# Patient Record
Sex: Male | Born: 1980 | Race: White | Hispanic: No | Marital: Married | Smoking: Former smoker
Health system: Southern US, Community
[De-identification: ages and names within clinical notes are randomized; demographics above are authoritative.]

## PROBLEM LIST (undated history)

## (undated) DIAGNOSIS — G8929 Other chronic pain: Secondary | ICD-10-CM

## (undated) DIAGNOSIS — F419 Anxiety disorder, unspecified: Secondary | ICD-10-CM

## (undated) DIAGNOSIS — M549 Dorsalgia, unspecified: Secondary | ICD-10-CM

## (undated) DIAGNOSIS — M542 Cervicalgia: Secondary | ICD-10-CM

## (undated) HISTORY — PX: HERNIA REPAIR: SHX51

## (undated) HISTORY — PX: APPENDECTOMY: SHX54

## (undated) HISTORY — PX: EYE SURGERY: SHX253

---

## 2012-12-17 ENCOUNTER — Encounter: Payer: Self-pay | Admitting: Anesthesiology

## 2013-01-13 ENCOUNTER — Encounter: Payer: Self-pay | Admitting: Anesthesiology

## 2013-02-12 ENCOUNTER — Encounter: Payer: Self-pay | Admitting: Anesthesiology

## 2013-05-29 ENCOUNTER — Ambulatory Visit: Payer: Self-pay

## 2013-05-29 LAB — CBC WITH DIFFERENTIAL/PLATELET
Basophil #: 0 10*3/uL (ref 0.0–0.1)
Basophil %: 0.3 %
Eosinophil #: 0.5 10*3/uL (ref 0.0–0.7)
Eosinophil %: 3.8 %
HCT: 44.4 % (ref 40.0–52.0)
HGB: 14.8 g/dL (ref 13.0–18.0)
LYMPHS PCT: 11.5 %
Lymphocyte #: 1.4 10*3/uL (ref 1.0–3.6)
MCH: 29 pg (ref 26.0–34.0)
MCHC: 33.3 g/dL (ref 32.0–36.0)
MCV: 87 fL (ref 80–100)
Monocyte #: 1.1 x10 3/mm — ABNORMAL HIGH (ref 0.2–1.0)
Monocyte %: 8.8 %
Neutrophil #: 9.5 10*3/uL — ABNORMAL HIGH (ref 1.4–6.5)
Neutrophil %: 75.6 %
PLATELETS: 266 10*3/uL (ref 150–440)
RBC: 5.11 10*6/uL (ref 4.40–5.90)
RDW: 13.2 % (ref 11.5–14.5)
WBC: 12.6 10*3/uL — ABNORMAL HIGH (ref 3.8–10.6)

## 2013-05-29 LAB — COMPREHENSIVE METABOLIC PANEL
ALBUMIN: 4.8 g/dL (ref 3.4–5.0)
ALT: 70 U/L (ref 12–78)
Alkaline Phosphatase: 122 U/L — ABNORMAL HIGH
Anion Gap: 15 (ref 7–16)
BUN: 10 mg/dL (ref 7–18)
Bilirubin,Total: 0.5 mg/dL (ref 0.2–1.0)
CHLORIDE: 99 mmol/L (ref 98–107)
CREATININE: 1.04 mg/dL (ref 0.60–1.30)
Calcium, Total: 9.2 mg/dL (ref 8.5–10.1)
Co2: 22 mmol/L (ref 21–32)
EGFR (African American): >60
EGFR (Non-African Amer.): >60
GLUCOSE: 114 mg/dL — AB (ref 65–99)
OSMOLALITY: 272 (ref 275–301)
Potassium: 3.6 mmol/L (ref 3.5–5.1)
SGOT(AST): 27 U/L (ref 15–37)
SODIUM: 136 mmol/L (ref 136–145)
TOTAL PROTEIN: 8.3 g/dL — AB (ref 6.4–8.2)

## 2014-10-05 ENCOUNTER — Ambulatory Visit
Admission: EM | Admit: 2014-10-05 | Discharge: 2014-10-05 | Disposition: A | Discharge status: Home / Self Care | Payer: Medicare Other | Attending: Family Medicine | Admitting: Family Medicine

## 2014-10-05 DIAGNOSIS — H6012 Cellulitis of left external ear: Secondary | ICD-10-CM | POA: Diagnosis not present

## 2014-10-05 HISTORY — DX: Anxiety disorder, unspecified: F41.9

## 2014-10-05 HISTORY — DX: Other chronic pain: G89.29

## 2014-10-05 HISTORY — DX: Cervicalgia: M54.2

## 2014-10-05 HISTORY — DX: Dorsalgia, unspecified: M54.9

## 2014-10-05 MED ORDER — SULFAMETHOXAZOLE-TRIMETHOPRIM 800-160 MG PO TABS: 1.0000 | ORAL_TABLET | Freq: Two times a day (BID) | ORAL | Status: DC

## 2014-10-05 NOTE — ED Notes (Signed)
Compliant of possible bug bite to left ear helix x 1 week.

## 2014-10-05 NOTE — ED Provider Notes (Signed)
CSN: [id number]     Arrival date & time 10/05/14  1724 History   First MD Initiated Contact with Patient 10/05/14 1815     Chief Complaint  Patient presents with  . "Ear infection from insect bite"    (Consider location/radiation/quality/duration/timing/severity/associated sxs/prior Treatment) HPI Comments: Patient presents with a 1 week h/o left earlobe pain, redness, and a h/o pus drainage several days ago. States thinks was bitten by some insect but not sure. Denies any fevers, chills, or hearing problems.  The history is provided by the patient.    Past Medical History  Diagnosis Date  . Anxiety   . Chronic back pain   . Chronic neck pain    Past Surgical History  Procedure Laterality Date  . Appendectomy    . Hernia repair    . Eye surgery      Cataract Surgery   No family history on file. History  Substance Use Topics  . Smoking status: Former Games developermoker  . Smokeless tobacco: Not on file  . Alcohol Use: No    Review of Systems  Allergies  Wellbutrin  Home Medications   Prior to Admission medications   Medication Sig Start Date End Date Taking? Authorizing Provider  amitriptyline (ELAVIL) 50 MG tablet Take 50 mg by mouth at bedtime as needed for sleep.   Yes Historical Provider, MD  cyclobenzaprine (FLEXERIL) 10 MG tablet Take 10 mg by mouth 3 (three) times daily as needed for muscle spasms.   Yes Historical Provider, MD  escitalopram (LEXAPRO) 10 MG tablet Take 10 mg by mouth daily.   Yes Historical Provider, MD  oxyCODONE-acetaminophen (PERCOCET/ROXICET) 5-325 MG per tablet Take by mouth every 6 (six) hours as needed for severe pain.   Yes Historical Provider, MD  VERAPAMIL HCL PO Take by mouth.   Yes Historical Provider, MD  sulfamethoxazole-trimethoprim (BACTRIM DS,SEPTRA DS) 800-160 MG per tablet Take 1 tablet by mouth 2 (two) times daily. 10/05/14   Payton Mccallumrlando Janessa Mickle, MD   BP 147/89 mmHg  Pulse 97  Temp(Src) 97.9 F (36.6 C) (Oral)  Resp 18  Ht 5\' 10"  (1.778  m)  Wt 185 lb (83.915 kg)  BMI 26.54 kg/m2  SpO2 98% Physical Exam  Constitutional: He appears well-developed and well-nourished. No distress.  Skin: He is not diaphoretic. There is erythema.  Left upper earlobe skin with blanchable erythema and tenderness to palpation; no purulent drainage    ED Course  Procedures (including critical care time) Labs Review Labs Reviewed - No data to display  Imaging Review No results found.   MDM   1. Cellulitis of earlobe, left    Discharge Medication List as of 10/05/2014  6:37 PM    START taking these medications   Details  sulfamethoxazole-trimethoprim (BACTRIM DS,SEPTRA DS) 800-160 MG per tablet Take 1 tablet by mouth 2 (two) times daily., Starting 10/05/2014, Until Discontinued, Normal      Plan: 1. Diagnosis reviewed with patient 2. rx as per orders; risks, benefits, potential side effects reviewed with patient 3. Recommend supportive treatment with warm compresses 4. F/u prn if symptoms worsen or don't improve    Payton Mccallumrlando Jashay Roddy, MD 10/05/14 [phone]

## 2015-04-29 ENCOUNTER — Ambulatory Visit
Admission: EM | Admit: 2015-04-29 | Discharge: 2015-04-29 | Disposition: A | Discharge status: Home / Self Care | Payer: Medicare Other | Attending: Family Medicine | Admitting: Family Medicine

## 2015-04-29 ENCOUNTER — Encounter: Payer: Self-pay | Admitting: Emergency Medicine

## 2015-04-29 DIAGNOSIS — H6981 Other specified disorders of Eustachian tube, right ear: Secondary | ICD-10-CM

## 2015-04-29 MED ORDER — FLUTICASONE PROPIONATE 50 MCG/ACT NA SUSP: 2.0000 | Freq: Every day | NASAL | Status: DC

## 2015-04-29 MED ORDER — CETIRIZINE-PSEUDOEPHEDRINE ER 5-120 MG PO TB12: 1.0000 | ORAL_TABLET | Freq: Two times a day (BID) | ORAL | Status: DC

## 2015-04-29 NOTE — ED Provider Notes (Signed)
CSN: [id number]     Arrival date & time 04/29/15  1548 History   First MD Initiated Contact with Patient 04/29/15 1613     Chief Complaint  Patient presents with  . Otalgia   (Consider location/radiation/quality/duration/timing/severity/associated sxs/prior Treatment) HPI   This a 34 year old male who presents with right ear pain he had last 3 days or so. Radiates into the lower part of his ear into his neck was also swollen more so last night than today. He has had some drainage from his ear but denies any decreased  hearing or ringing. The drainage is characterized as oily. His father advised him to put peroxide and in his ear; he states that he had large return of brownish material. This did not completely help his ear pain though. He denies any fever or chills. He had numerous problems with his ears as a child requiring multiple tubes. His PCP is evaluating his gait due to imbalance. He is using a cane to ambulate  Past Medical History  Diagnosis Date  . Anxiety   . Chronic back pain   . Chronic neck pain    Past Surgical History  Procedure Laterality Date  . Appendectomy    . Hernia repair    . Eye surgery      Cataract Surgery   History reviewed. No pertinent family history. Social History  Substance Use Topics  . Smoking status: Former Games developermoker  . Smokeless tobacco: None  . Alcohol Use: No    Review of Systems  Constitutional: Negative for fever, chills, activity change, appetite change and fatigue.  HENT: Positive for ear discharge, ear pain and facial swelling. Negative for hearing loss, postnasal drip, rhinorrhea, sinus pressure, sore throat and tinnitus.     Allergies  Wellbutrin  Home Medications   Prior to Admission medications   Medication Sig Start Date End Date Taking? Authorizing Provider  amitriptyline (ELAVIL) 50 MG tablet Take 50 mg by mouth at bedtime as needed for sleep.    Historical Provider, MD  cetirizine-pseudoephedrine (ZYRTEC-D) 5-120 MG  tablet Take 1 tablet by mouth 2 (two) times daily. 04/29/15   Lutricia FeilWilliam P Emile Ringgenberg, PA-C  cyclobenzaprine (FLEXERIL) 10 MG tablet Take 10 mg by mouth 3 (three) times daily as needed for muscle spasms.    Historical Provider, MD  escitalopram (LEXAPRO) 10 MG tablet Take 10 mg by mouth daily.    Historical Provider, MD  fluticasone (FLONASE) 50 MCG/ACT nasal spray Place 2 sprays into both nostrils daily. 04/29/15   Lutricia FeilWilliam P Ashna Dorough, PA-C  oxyCODONE-acetaminophen (PERCOCET/ROXICET) 5-325 MG per tablet Take by mouth every 6 (six) hours as needed for severe pain.    Historical Provider, MD  sulfamethoxazole-trimethoprim (BACTRIM DS,SEPTRA DS) 800-160 MG per tablet Take 1 tablet by mouth 2 (two) times daily. 10/05/14   Payton Mccallumrlando Conty, MD  VERAPAMIL HCL PO Take 240 mg by mouth at bedtime.     Historical Provider, MD   Meds Ordered and Administered this Visit  Medications - No data to display  BP 150/88 mmHg  Pulse 94  Temp(Src) 98 F (36.7 C) (Tympanic)  Resp 16  Ht 5\' 10"  (1.778 m)  Wt 175 lb (79.379 kg)  BMI 25.11 kg/m2  SpO2 99% No data found.   Physical Exam  Constitutional: He is oriented to person, place, and time. He appears well-developed and well-nourished. No distress.  HENT:  Head: Normocephalic and atraumatic.  Examination the right ear is a small amount of cerumen inferiorly. Eardrum is visualized but is  seen to have air-fluid level behind. No erythema present. A light reflex is present but scattered. Is no active drainage present and no debris is noted in the canal other than the above-mentioned cerumen. There is no adenopathy present. There is some mild tenderness inferior to the ear over the throat.  Eyes: Conjunctivae are normal. Pupils are equal, round, and reactive to light.  Neck: Normal range of motion. Neck supple.  Musculoskeletal: Normal range of motion. He exhibits no edema or tenderness.  Vision ambulates with a cane due to balance problem  Lymphadenopathy:    He has no  cervical adenopathy.  Neurological: He is alert and oriented to person, place, and time.  Skin: Skin is warm and dry. He is not diaphoretic.  Psychiatric: He has a normal mood and affect. His behavior is normal. Judgment and thought content normal.  Nursing note and vitals reviewed.   ED Course  Procedures (including critical care time)  Labs Review Labs Reviewed - No data to display  Imaging Review No results found.   Visual Acuity Review  Right Eye Distance:   Left Eye Distance:   Bilateral Distance:    Right Eye Near:   Left Eye Near:    Bilateral Near:         MDM   1. Dysfunction of right Eustachian tube    Discharge Medication List as of 04/29/2015  4:34 PM    START taking these medications   Details  cetirizine-pseudoephedrine (ZYRTEC-D) 5-120 MG tablet Take 1 tablet by mouth 2 (two) times daily., Starting 04/29/2015, Until Discontinued, Normal    fluticasone (FLONASE) 50 MCG/ACT nasal spray Place 2 sprays into both nostrils daily., Starting 04/29/2015, Until Discontinued, Normal      Plan: 1. Diagnosis reviewed with patient 2. rx as per orders; risks, benefits, potential side effects reviewed with patient 3. Recommend supportive treatment with use of Flonase and decongestants. I told him to be seen by ENT if he continues to have problems within a week or 2. 4. F/u prn if symptoms worsen or don't improve      Lutricia Feil, PA-C 04/29/15 1723

## 2015-04-29 NOTE — ED Notes (Signed)
Patient c/o pain in his right ear since yesterday.  Patient denies fevers.

## 2016-04-28 ENCOUNTER — Ambulatory Visit
Admission: EM | Admit: 2016-04-28 | Discharge: 2016-04-28 | Disposition: A | Discharge status: Home / Self Care | Payer: Medicare Other | Attending: Family Medicine | Admitting: Family Medicine

## 2016-04-28 ENCOUNTER — Ambulatory Visit: Payer: Medicare Other

## 2016-04-28 ENCOUNTER — Encounter: Payer: Self-pay | Admitting: Gynecology

## 2016-04-28 DIAGNOSIS — Z888 Allergy status to other drugs, medicaments and biological substances status: Secondary | ICD-10-CM | POA: Insufficient documentation

## 2016-04-28 DIAGNOSIS — Z9889 Other specified postprocedural states: Secondary | ICD-10-CM | POA: Insufficient documentation

## 2016-04-28 DIAGNOSIS — F419 Anxiety disorder, unspecified: Secondary | ICD-10-CM | POA: Insufficient documentation

## 2016-04-28 DIAGNOSIS — Z79899 Other long term (current) drug therapy: Secondary | ICD-10-CM | POA: Diagnosis not present

## 2016-04-28 DIAGNOSIS — S2241XA Multiple fractures of ribs, right side, initial encounter for closed fracture: Secondary | ICD-10-CM

## 2016-04-28 DIAGNOSIS — Z87891 Personal history of nicotine dependence: Secondary | ICD-10-CM | POA: Diagnosis not present

## 2016-04-28 DIAGNOSIS — G8929 Other chronic pain: Secondary | ICD-10-CM | POA: Insufficient documentation

## 2016-04-28 DIAGNOSIS — W19XXXA Unspecified fall, initial encounter: Secondary | ICD-10-CM | POA: Insufficient documentation

## 2016-04-28 MED ORDER — HYDROCODONE-ACETAMINOPHEN 5-325 MG PO TABS: ORAL_TABLET | ORAL | 0 refills | Status: DC

## 2016-04-28 NOTE — ED Triage Notes (Signed)
Per patient fall on his right side at home x 6 days ago. Patient stated while sitting on a chair at home fell on his right side. Per patient now with right rib pain and bruise at nipple area.

## 2016-04-28 NOTE — ED Provider Notes (Signed)
[facility]    CSN: [id number] Arrival date & time: 04/28/16  1416     History   Chief Complaint Chief Complaint  Patient presents with  . Fall    HPI Jerry Lester is a 35 y.o. male.   35 yo male with a c/o pain to mid right chest wall/ribs for the past 6 days since falling at home and hitting his ribs. Denies shortness of breath, fevers, chills.    The history is provided by the patient.  Fall     Past Medical History:  Diagnosis Date  . Anxiety   . Chronic back pain   . Chronic neck pain     There are no active problems to display for this patient.   Past Surgical History:  Procedure Laterality Date  . APPENDECTOMY    . EYE SURGERY     Cataract Surgery  . HERNIA REPAIR         Home Medications    Prior to Admission medications   Medication Sig Start Date End Date Taking? Authorizing Provider  amitriptyline (ELAVIL) 50 MG tablet Take 50 mg by mouth at bedtime as needed for sleep.   Yes Historical Provider, MD  cyclobenzaprine (FLEXERIL) 10 MG tablet Take 10 mg by mouth 3 (three) times daily as needed for muscle spasms.   Yes Historical Provider, MD  escitalopram (LEXAPRO) 10 MG tablet Take 10 mg by mouth daily.   Yes Historical Provider, MD  oxyCODONE-acetaminophen (PERCOCET/ROXICET) 5-325 MG per tablet Take by mouth every 6 (six) hours as needed for severe pain.   Yes Historical Provider, MD  VERAPAMIL HCL PO Take 240 mg by mouth at bedtime.    Yes Historical Provider, MD  cetirizine-pseudoephedrine (ZYRTEC-D) 5-120 MG tablet Take 1 tablet by mouth 2 (two) times daily. 04/29/15   Lutricia FeilWilliam P Roemer, PA-C  fluticasone (FLONASE) 50 MCG/ACT nasal spray Place 2 sprays into both nostrils daily. 04/29/15   Lutricia FeilWilliam P Roemer, PA-C  HYDROcodone-acetaminophen (NORCO/VICODIN) 5-325 MG tablet 1-2 tabs po bid prn 04/28/16   Payton Mccallumrlando Legacy Lacivita, MD  sulfamethoxazole-trimethoprim (BACTRIM DS,SEPTRA DS) 800-160 MG per tablet Take 1 tablet by mouth 2 (two) times  daily. 10/05/14   Payton Mccallumrlando Avionna Bower, MD    Family History No family history on file.  Social History Social History  Substance Use Topics  . Smoking status: Former Games developermoker  . Smokeless tobacco: Never Used  . Alcohol use No     Allergies   Wellbutrin [bupropion]   Review of Systems Review of Systems   Physical Exam Triage Vital Signs ED Triage Vitals  Enc Vitals Group     BP 04/28/16 1640 (!) 147/73     Pulse Rate 04/28/16 1640 86     Resp 04/28/16 1640 16     Temp 04/28/16 1640 98.6 F (37 C)     Temp Source 04/28/16 1640 Oral     SpO2 04/28/16 1640 100 %     Weight 04/28/16 1643 190 lb (86.2 kg)     Height 04/28/16 1643 5\' 10"  (1.778 m)     Head Circumference --      Peak Flow --      Pain Score 04/28/16 1645 8     Pain Loc --      Pain Edu? --      Excl. in GC? --    No data found.   Updated Vital Signs BP (!) 147/73 (BP Location: Left Arm)   Pulse 86   Temp 98.6 F (37  C) (Oral)   Resp 16   Ht 5\' 10"  (1.778 m)   Wt 190 lb (86.2 kg)   SpO2 100%   BMI 27.26 kg/m   Visual Acuity Right Eye Distance:   Left Eye Distance:   Bilateral Distance:    Right Eye Near:   Left Eye Near:    Bilateral Near:     Physical Exam  Constitutional: He appears well-developed and well-nourished. No distress.  Cardiovascular: Normal rate, regular rhythm and normal heart sounds.   No murmur heard. Pulmonary/Chest: Effort normal and breath sounds normal. No respiratory distress. He has no wheezes. He has no rales. He exhibits tenderness (right mid chest wall ribs).  Skin: He is not diaphoretic.  Nursing note and vitals reviewed.    UC Treatments / Results  Labs (all labs ordered are listed, but only abnormal results are displayed) Labs Reviewed - No data to display  EKG  EKG Interpretation None       Radiology Dg Ribs Unilateral W/chest Right  Result Date: 04/28/2016 CLINICAL DATA:  Right rib pain, fall 2 days ago. EXAM: RIGHT RIBS AND CHEST - 3+ VIEW  COMPARISON:  None. FINDINGS: No pneumothorax or pleural effusion. Linear subsegmental atelectasis or scarring along the left lung base laterally. Cardiac and mediastinal margins appear normal. On one of the views, there is cortical irregularity of the anterior most portion of the right fifth and sixth rib suspicious for nondisplaced fractures. IMPRESSION: 1. Nondisplaced right fifth and sixth rib fractures anteriorly. No pleural effusion or pneumothorax. Electronically Signed   By: Gaylyn RongWalter  Liebkemann M.D.   On: 04/28/2016 17:08    Procedures Procedures (including critical care time)  Medications Ordered in UC Medications - No data to display   Initial Impression / Assessment and Plan / UC Course  I have reviewed the triage vital signs and the nursing notes.  Pertinent labs & imaging results that were available during my care of the patient were reviewed by me and considered in my medical decision making (see chart for details).  Clinical Course       Final Clinical Impressions(s) / UC Diagnoses   Final diagnoses:  Closed fracture of multiple ribs of right side, initial encounter    New Prescriptions Discharge Medication List as of 04/28/2016  5:26 PM    START taking these medications   Details  HYDROcodone-acetaminophen (NORCO/VICODIN) 5-325 MG tablet 1-2 tabs po bid prn, Print       1. x-ray results and diagnosis reviewed with patient 2. rx as per orders above; reviewed possible side effects, interactions, risks and benefits  3. Recommend supportive treatment with rest, ice 4. Follow up with PCP next week 5. Follow-up prn if symptoms worsen or don't improve   Payton Mccallumrlando Anibal Quinby, MD 04/28/16 1925

## 2016-05-10 ENCOUNTER — Ambulatory Visit
Admission: EM | Admit: 2016-05-10 | Discharge: 2016-05-10 | Disposition: A | Discharge status: Home / Self Care | Payer: Medicare Other | Attending: Internal Medicine | Admitting: Internal Medicine

## 2016-05-10 ENCOUNTER — Encounter: Payer: Self-pay | Admitting: Emergency Medicine

## 2016-05-10 DIAGNOSIS — R69 Illness, unspecified: Secondary | ICD-10-CM

## 2016-05-10 DIAGNOSIS — J209 Acute bronchitis, unspecified: Secondary | ICD-10-CM

## 2016-05-10 DIAGNOSIS — H6691 Otitis media, unspecified, right ear: Secondary | ICD-10-CM | POA: Diagnosis not present

## 2016-05-10 DIAGNOSIS — J111 Influenza due to unidentified influenza virus with other respiratory manifestations: Secondary | ICD-10-CM

## 2016-05-10 MED ORDER — PREDNISONE 50 MG PO TABS: 50.0000 mg | ORAL_TABLET | Freq: Every day | ORAL | 0 refills | Status: AC

## 2016-05-10 MED ORDER — BENZONATATE 200 MG PO CAPS: 200.0000 mg | ORAL_CAPSULE | Freq: Three times a day (TID) | ORAL | 1 refills | Status: DC | PRN

## 2016-05-10 MED ORDER — IPRATROPIUM-ALBUTEROL 0.5-2.5 (3) MG/3ML IN SOLN
3.0000 mL | Freq: Once | RESPIRATORY_TRACT | Status: AC
  Administered 2016-05-10: 3 mL via RESPIRATORY_TRACT

## 2016-05-10 MED ORDER — METHYLPREDNISOLONE SODIUM SUCC 125 MG IJ SOLR
125.0000 mg | Freq: Once | INTRAMUSCULAR | Status: AC
  Administered 2016-05-10: 125 mg via INTRAMUSCULAR

## 2016-05-10 MED ORDER — AZITHROMYCIN 250 MG PO TABS: 250.0000 mg | ORAL_TABLET | Freq: Every day | ORAL | 0 refills | Status: DC

## 2016-05-10 NOTE — ED Triage Notes (Addendum)
Cough, congested for 5 days. Has red streaks in clear phlegm today

## 2016-05-10 NOTE — Discharge Instructions (Addendum)
Anticipate gradual improvement in cough/well being over the next several days.  Recheck or followup with primary care provider for new fever >100.5, increasing phlegm production/nasal discharge, or if not starting to improve in a few days.  Prescriptions sent to the [facility] in [place].

## 2016-05-10 NOTE — ED Provider Notes (Signed)
[facility]    CSN: [id number] Arrival date & time: 05/10/16  1242     History   Chief Complaint Chief Complaint  Patient presents with  . Cough    HPI Jerry Lester is a 35 y.o. male. Patient presents today with at least a 5 day history of coughing spasms, feels like it is nonstop. Scant mucus production, tiny streak of blood yesterday. Some tactile temperatures. Nose is a little runny today, but has not particularly been congested or runny. No sore throat. Some posttussive emesis. Not vomiting otherwise. No diarrhea. A lot of achiness around his ribs, particularly with cough.   HPI  Past Medical History:  Diagnosis Date  . Anxiety   . Chronic back pain   . Chronic neck pain      Past Surgical History:  Procedure Laterality Date  . APPENDECTOMY    . EYE SURGERY     Cataract Surgery  . HERNIA REPAIR         Home Medications    Prior to Admission medications   Medication Sig Start Date End Date Taking? Authorizing Provider  amitriptyline (ELAVIL) 50 MG tablet Take 50 mg by mouth at bedtime as needed for sleep.   Yes Historical Provider, MD  cetirizine-pseudoephedrine (ZYRTEC-D) 5-120 MG tablet Take 1 tablet by mouth 2 (two) times daily. 04/29/15  Yes Lutricia FeilWilliam P Roemer, PA-C  cyclobenzaprine (FLEXERIL) 10 MG tablet Take 10 mg by mouth 3 (three) times daily as needed for muscle spasms.   Yes Historical Provider, MD  escitalopram (LEXAPRO) 10 MG tablet Take 10 mg by mouth daily.   Yes Historical Provider, MD  HYDROcodone-acetaminophen (NORCO/VICODIN) 5-325 MG tablet 1-2 tabs po bid prn 04/28/16  Yes Payton Mccallumrlando Conty, MD  oxyCODONE-acetaminophen (PERCOCET/ROXICET) 5-325 MG per tablet Take by mouth every 6 (six) hours as needed for severe pain.   Yes Historical Provider, MD  VERAPAMIL HCL PO Take 240 mg by mouth at bedtime.    Yes Historical Provider, MD  azithromycin (ZITHROMAX) 250 MG tablet Take 1 tablet (250 mg total) by mouth daily. Take first 2 tablets  together, then 1 every day until finished. 05/10/16   Eustace MooreLaura W Shaheer Bonfield, MD  benzonatate (TESSALON) 200 MG capsule Take 1 capsule (200 mg total) by mouth 3 (three) times daily as needed for cough. 05/10/16   Eustace MooreLaura W Lakoda Mcanany, MD  fluticasone [facility](FLONASE) 50 MCG/ACT nasal spray Place 2 sprays into both nostrils daily. 04/29/15   Lutricia FeilWilliam P Roemer, PA-C  predniSONE (DELTASONE) 50 MG tablet Take 1 tablet (50 mg total) by mouth daily. 05/10/16 05/15/16  Eustace MooreLaura W Zaineb Nowaczyk, MD    Family History No family history on file.  Social History Social History  Substance Use Topics  . Smoking status: Former Games developermoker  . Smokeless tobacco: Never Used  . Alcohol use No     Allergies   Wellbutrin [bupropion]   Review of Systems Review of Systems  All other systems reviewed and are negative.    Physical Exam Triage Vital Signs ED Triage Vitals  Enc Vitals Group     BP 05/10/16 1454 131/86     Pulse Rate 05/10/16 1454 90     Resp 05/10/16 1454 18     Temp 05/10/16 1454 98.4 F (36.9 C)     Temp Source 05/10/16 1454 Tympanic     SpO2 05/10/16 1454 100 %     Weight 05/10/16 1457 190 lb (86.2 kg)     Height 05/10/16 1457 5\' 10"  (1.778 m)  Pain Score 05/10/16 1458 8   Updated Vital Signs BP 131/86 (BP Location: Left Arm)   Pulse 90   Temp 98.4 F (36.9 C) (Tympanic)   Resp 18   Ht 5\' 10"  (1.778 m)   Wt 190 lb (86.2 kg)   SpO2 100%   BMI 27.26 kg/m  Physical Exam  Constitutional: He is oriented to person, place, and time. No distress.  Alert, nicely groomed  HENT:  Head: Atraumatic.  Eyes:  Conjugate gaze, no eye redness/drainage  Neck: Neck supple.  Cardiovascular: Normal rate.   Pulmonary/Chest: No respiratory distress. He has no wheezes. He has no rales.   breath sounds are quite coarse, but symmetric throughout  Abdominal: He exhibits no distension.  Musculoskeletal: Normal range of motion.  Neurological: He is alert and oriented to person, place, and time.  Skin: Skin is warm and  dry.  No cyanosis  Nursing note and vitals reviewed.    UC Treatments / Results   Procedures Procedures (including critical care time)  Medications Ordered in UC Medications  ipratropium-albuterol (DUONEB) 0.5-2.5 (3) MG/3ML nebulizer solution 3 mL (not administered)  methylPREDNISolone sodium succinate (SOLU-MEDROL) 125 mg/2 mL injection 125 mg (125 mg Intramuscular Given 05/10/16 1552)    Final Clinical Impressions(s) / UC Diagnoses   Final diagnoses:  Acute bronchitis with bronchospasm  Influenza-like illness  Right acute otitis media   Anticipate gradual improvement in cough/well being over the next several days.  Recheck or followup with primary care provider for new fever >100.5, increasing phlegm production/nasal discharge, or if not starting to improve in a few days.  Prescriptions sent to the [facility] in [place].    New Prescriptions New Prescriptions   AZITHROMYCIN (ZITHROMAX) 250 MG TABLET    Take 1 tablet (250 mg total) by mouth daily. Take first 2 tablets together, then 1 every day until finished.   BENZONATATE (TESSALON) 200 MG CAPSULE    Take 1 capsule (200 mg total) by mouth 3 (three) times daily as needed for cough.   PREDNISONE (DELTASONE) 50 MG TABLET    Take 1 tablet (50 mg total) by mouth daily.     Eustace MooreLaura W Arieon Corcoran, MD 05/13/16 [phone]

## 2016-06-09 DIAGNOSIS — M545 Low back pain: Secondary | ICD-10-CM | POA: Diagnosis not present

## 2016-06-12 DIAGNOSIS — M79621 Pain in right upper arm: Secondary | ICD-10-CM | POA: Diagnosis not present

## 2016-06-12 DIAGNOSIS — M25522 Pain in left elbow: Secondary | ICD-10-CM | POA: Diagnosis not present

## 2016-06-12 DIAGNOSIS — M25512 Pain in left shoulder: Secondary | ICD-10-CM | POA: Diagnosis not present

## 2016-06-12 DIAGNOSIS — M25511 Pain in right shoulder: Secondary | ICD-10-CM | POA: Diagnosis not present

## 2016-06-12 DIAGNOSIS — M5412 Radiculopathy, cervical region: Secondary | ICD-10-CM | POA: Diagnosis not present

## 2016-06-12 DIAGNOSIS — G894 Chronic pain syndrome: Secondary | ICD-10-CM | POA: Diagnosis not present

## 2016-06-12 DIAGNOSIS — M79622 Pain in left upper arm: Secondary | ICD-10-CM | POA: Diagnosis not present

## 2016-06-12 DIAGNOSIS — M25521 Pain in right elbow: Secondary | ICD-10-CM | POA: Diagnosis not present

## 2016-06-12 DIAGNOSIS — Z79891 Long term (current) use of opiate analgesic: Secondary | ICD-10-CM | POA: Diagnosis not present

## 2016-06-13 DIAGNOSIS — H26493 Other secondary cataract, bilateral: Secondary | ICD-10-CM | POA: Diagnosis not present

## 2016-06-19 DIAGNOSIS — Z87891 Personal history of nicotine dependence: Secondary | ICD-10-CM | POA: Diagnosis not present

## 2016-06-19 DIAGNOSIS — Z79899 Other long term (current) drug therapy: Secondary | ICD-10-CM | POA: Diagnosis not present

## 2016-06-19 DIAGNOSIS — Z13220 Encounter for screening for lipoid disorders: Secondary | ICD-10-CM | POA: Diagnosis not present

## 2016-06-19 DIAGNOSIS — R42 Dizziness and giddiness: Secondary | ICD-10-CM | POA: Diagnosis not present

## 2016-06-19 DIAGNOSIS — Z8669 Personal history of other diseases of the nervous system and sense organs: Secondary | ICD-10-CM | POA: Diagnosis not present

## 2016-06-19 DIAGNOSIS — R948 Abnormal results of function studies of other organs and systems: Secondary | ICD-10-CM | POA: Diagnosis not present

## 2016-06-19 DIAGNOSIS — Z23 Encounter for immunization: Secondary | ICD-10-CM | POA: Diagnosis not present

## 2016-06-19 DIAGNOSIS — F419 Anxiety disorder, unspecified: Secondary | ICD-10-CM | POA: Diagnosis not present

## 2016-06-19 DIAGNOSIS — Z131 Encounter for screening for diabetes mellitus: Secondary | ICD-10-CM | POA: Diagnosis not present

## 2016-06-27 DIAGNOSIS — R002 Palpitations: Secondary | ICD-10-CM | POA: Diagnosis not present

## 2016-06-27 DIAGNOSIS — R0602 Shortness of breath: Secondary | ICD-10-CM | POA: Diagnosis not present

## 2016-06-27 DIAGNOSIS — R42 Dizziness and giddiness: Secondary | ICD-10-CM | POA: Diagnosis not present

## 2016-06-29 DIAGNOSIS — Z6826 Body mass index (BMI) 26.0-26.9, adult: Secondary | ICD-10-CM | POA: Diagnosis not present

## 2016-06-29 DIAGNOSIS — M542 Cervicalgia: Secondary | ICD-10-CM | POA: Diagnosis not present

## 2016-06-29 DIAGNOSIS — I10 Essential (primary) hypertension: Secondary | ICD-10-CM | POA: Diagnosis not present

## 2016-06-29 DIAGNOSIS — M47812 Spondylosis without myelopathy or radiculopathy, cervical region: Secondary | ICD-10-CM | POA: Diagnosis not present

## 2016-06-30 ENCOUNTER — Other Ambulatory Visit: Payer: Self-pay | Admitting: Orthopaedic Surgery

## 2016-06-30 DIAGNOSIS — M542 Cervicalgia: Secondary | ICD-10-CM

## 2016-06-30 DIAGNOSIS — M47812 Spondylosis without myelopathy or radiculopathy, cervical region: Secondary | ICD-10-CM

## 2016-07-03 DIAGNOSIS — Z961 Presence of intraocular lens: Secondary | ICD-10-CM | POA: Diagnosis not present

## 2016-07-04 DIAGNOSIS — R42 Dizziness and giddiness: Secondary | ICD-10-CM | POA: Diagnosis not present

## 2016-07-17 DIAGNOSIS — M79622 Pain in left upper arm: Secondary | ICD-10-CM | POA: Diagnosis not present

## 2016-07-17 DIAGNOSIS — G894 Chronic pain syndrome: Secondary | ICD-10-CM | POA: Diagnosis not present

## 2016-07-17 DIAGNOSIS — M79621 Pain in right upper arm: Secondary | ICD-10-CM | POA: Diagnosis not present

## 2016-07-17 DIAGNOSIS — M545 Low back pain: Secondary | ICD-10-CM | POA: Diagnosis not present

## 2016-07-17 DIAGNOSIS — Z79891 Long term (current) use of opiate analgesic: Secondary | ICD-10-CM | POA: Diagnosis not present

## 2016-07-17 DIAGNOSIS — M25512 Pain in left shoulder: Secondary | ICD-10-CM | POA: Diagnosis not present

## 2016-07-17 DIAGNOSIS — M542 Cervicalgia: Secondary | ICD-10-CM | POA: Diagnosis not present

## 2016-07-17 DIAGNOSIS — M25522 Pain in left elbow: Secondary | ICD-10-CM | POA: Diagnosis not present

## 2016-07-19 ENCOUNTER — Ambulatory Visit
Admission: RE | Admit: 2016-07-19 | Discharge: 2016-07-19 | Disposition: A | Discharge status: Home / Self Care | Payer: Medicare Other | Source: Ambulatory Visit | Attending: Orthopaedic Surgery | Admitting: Orthopaedic Surgery

## 2016-07-19 DIAGNOSIS — M50222 Other cervical disc displacement at C5-C6 level: Secondary | ICD-10-CM | POA: Diagnosis not present

## 2016-07-19 DIAGNOSIS — M542 Cervicalgia: Secondary | ICD-10-CM | POA: Diagnosis present

## 2016-07-19 DIAGNOSIS — M50221 Other cervical disc displacement at C4-C5 level: Secondary | ICD-10-CM | POA: Diagnosis not present

## 2016-07-19 DIAGNOSIS — M4802 Spinal stenosis, cervical region: Secondary | ICD-10-CM | POA: Diagnosis not present

## 2016-07-19 DIAGNOSIS — M47812 Spondylosis without myelopathy or radiculopathy, cervical region: Secondary | ICD-10-CM

## 2016-07-25 DIAGNOSIS — R0602 Shortness of breath: Secondary | ICD-10-CM | POA: Diagnosis not present

## 2016-08-02 DIAGNOSIS — M542 Cervicalgia: Secondary | ICD-10-CM | POA: Diagnosis not present

## 2016-08-02 DIAGNOSIS — M47812 Spondylosis without myelopathy or radiculopathy, cervical region: Secondary | ICD-10-CM | POA: Diagnosis not present

## 2016-08-17 DIAGNOSIS — M5412 Radiculopathy, cervical region: Secondary | ICD-10-CM | POA: Diagnosis not present

## 2016-08-17 DIAGNOSIS — M25511 Pain in right shoulder: Secondary | ICD-10-CM | POA: Diagnosis not present

## 2016-08-17 DIAGNOSIS — Z79891 Long term (current) use of opiate analgesic: Secondary | ICD-10-CM | POA: Diagnosis not present

## 2016-08-17 DIAGNOSIS — M25521 Pain in right elbow: Secondary | ICD-10-CM | POA: Diagnosis not present

## 2016-08-17 DIAGNOSIS — M79621 Pain in right upper arm: Secondary | ICD-10-CM | POA: Diagnosis not present

## 2016-08-22 DIAGNOSIS — M4727 Other spondylosis with radiculopathy, lumbosacral region: Secondary | ICD-10-CM | POA: Diagnosis not present

## 2016-08-22 DIAGNOSIS — M5117 Intervertebral disc disorders with radiculopathy, lumbosacral region: Secondary | ICD-10-CM | POA: Diagnosis not present

## 2016-08-24 DIAGNOSIS — M47812 Spondylosis without myelopathy or radiculopathy, cervical region: Secondary | ICD-10-CM | POA: Diagnosis not present

## 2016-08-24 DIAGNOSIS — Z6826 Body mass index (BMI) 26.0-26.9, adult: Secondary | ICD-10-CM | POA: Diagnosis not present

## 2016-08-24 DIAGNOSIS — I10 Essential (primary) hypertension: Secondary | ICD-10-CM | POA: Diagnosis not present

## 2016-08-24 DIAGNOSIS — M791 Myalgia: Secondary | ICD-10-CM | POA: Diagnosis not present

## 2016-09-07 DIAGNOSIS — R259 Unspecified abnormal involuntary movements: Secondary | ICD-10-CM | POA: Diagnosis not present

## 2016-09-07 DIAGNOSIS — R4781 Slurred speech: Secondary | ICD-10-CM | POA: Diagnosis not present

## 2016-09-15 DIAGNOSIS — Z87891 Personal history of nicotine dependence: Secondary | ICD-10-CM | POA: Diagnosis not present

## 2016-09-15 DIAGNOSIS — G255 Other chorea: Secondary | ICD-10-CM | POA: Diagnosis not present

## 2016-09-15 DIAGNOSIS — G259 Extrapyramidal and movement disorder, unspecified: Secondary | ICD-10-CM | POA: Diagnosis not present

## 2016-09-15 DIAGNOSIS — R1310 Dysphagia, unspecified: Secondary | ICD-10-CM | POA: Diagnosis not present

## 2016-09-15 DIAGNOSIS — R471 Dysarthria and anarthria: Secondary | ICD-10-CM | POA: Diagnosis not present

## 2016-09-18 DIAGNOSIS — M545 Low back pain: Secondary | ICD-10-CM | POA: Diagnosis not present

## 2016-09-18 DIAGNOSIS — G894 Chronic pain syndrome: Secondary | ICD-10-CM | POA: Diagnosis not present

## 2016-09-18 DIAGNOSIS — M542 Cervicalgia: Secondary | ICD-10-CM | POA: Diagnosis not present

## 2016-09-18 DIAGNOSIS — Z79891 Long term (current) use of opiate analgesic: Secondary | ICD-10-CM | POA: Diagnosis not present

## 2016-09-29 DIAGNOSIS — G255 Other chorea: Secondary | ICD-10-CM | POA: Diagnosis not present

## 2016-09-29 DIAGNOSIS — R768 Other specified abnormal immunological findings in serum: Secondary | ICD-10-CM | POA: Diagnosis not present

## 2016-10-06 DIAGNOSIS — G255 Other chorea: Secondary | ICD-10-CM | POA: Diagnosis not present

## 2016-10-24 DIAGNOSIS — M47812 Spondylosis without myelopathy or radiculopathy, cervical region: Secondary | ICD-10-CM | POA: Diagnosis not present

## 2016-10-24 DIAGNOSIS — M542 Cervicalgia: Secondary | ICD-10-CM | POA: Diagnosis not present

## 2016-11-08 DIAGNOSIS — R768 Other specified abnormal immunological findings in serum: Secondary | ICD-10-CM | POA: Diagnosis not present

## 2016-11-08 DIAGNOSIS — G255 Other chorea: Secondary | ICD-10-CM | POA: Diagnosis not present

## 2017-06-02 ENCOUNTER — Encounter: Payer: Self-pay | Admitting: *Deleted

## 2017-06-02 ENCOUNTER — Ambulatory Visit
Admission: EM | Admit: 2017-06-02 | Discharge: 2017-06-02 | Disposition: A | Discharge status: Home / Self Care | Payer: Medicare Other | Attending: Family Medicine | Admitting: Family Medicine

## 2017-06-02 DIAGNOSIS — R112 Nausea with vomiting, unspecified: Secondary | ICD-10-CM | POA: Diagnosis not present

## 2017-06-02 DIAGNOSIS — R69 Illness, unspecified: Secondary | ICD-10-CM | POA: Diagnosis not present

## 2017-06-02 DIAGNOSIS — G43009 Migraine without aura, not intractable, without status migrainosus: Secondary | ICD-10-CM | POA: Insufficient documentation

## 2017-06-02 DIAGNOSIS — Z79899 Other long term (current) drug therapy: Secondary | ICD-10-CM | POA: Diagnosis not present

## 2017-06-02 DIAGNOSIS — J111 Influenza due to unidentified influenza virus with other respiratory manifestations: Secondary | ICD-10-CM | POA: Insufficient documentation

## 2017-06-02 DIAGNOSIS — F419 Anxiety disorder, unspecified: Secondary | ICD-10-CM | POA: Diagnosis not present

## 2017-06-02 DIAGNOSIS — R51 Headache: Secondary | ICD-10-CM | POA: Diagnosis present

## 2017-06-02 DIAGNOSIS — Z87891 Personal history of nicotine dependence: Secondary | ICD-10-CM | POA: Insufficient documentation

## 2017-06-02 LAB — BASIC METABOLIC PANEL
Anion gap: 8 (ref 5–15)
BUN: 13 mg/dL (ref 6–20)
CO2: 22 mmol/L (ref 22–32)
Calcium: 8.9 mg/dL (ref 8.9–10.3)
Chloride: 104 mmol/L (ref 101–111)
Creatinine, Ser: 1.1 mg/dL (ref 0.61–1.24)
GFR calc Af Amer: >60 mL/min (ref >60)
GLUCOSE: 133 mg/dL — AB (ref 65–99)
POTASSIUM: 3.6 mmol/L (ref 3.5–5.1)
Sodium: 134 mmol/L — ABNORMAL LOW (ref 135–145)

## 2017-06-02 MED ORDER — ONDANSETRON 8 MG PO TBDP
8.0000 mg | ORAL_TABLET | Freq: Once | ORAL | Status: AC
  Administered 2017-06-02: 8 mg via ORAL

## 2017-06-02 MED ORDER — KETOROLAC TROMETHAMINE 60 MG/2ML IM SOLN
60.0000 mg | Freq: Once | INTRAMUSCULAR | Status: AC
  Administered 2017-06-02: 60 mg via INTRAMUSCULAR

## 2017-06-02 MED ORDER — ACETAMINOPHEN 500 MG PO TABS
1000.0000 mg | ORAL_TABLET | Freq: Once | ORAL | Status: AC
  Administered 2017-06-02: 1000 mg via ORAL

## 2017-06-02 MED ORDER — HYDROCODONE-ACETAMINOPHEN 5-325 MG PO TABS: ORAL_TABLET | ORAL | 0 refills | Status: DC

## 2017-06-02 MED ORDER — ONDANSETRON 8 MG PO TBDP: 8.0000 mg | ORAL_TABLET | Freq: Three times a day (TID) | ORAL | 0 refills | Status: AC | PRN

## 2017-06-02 MED ORDER — OSELTAMIVIR PHOSPHATE 75 MG PO CAPS: 75.0000 mg | ORAL_CAPSULE | Freq: Two times a day (BID) | ORAL | 0 refills | Status: DC

## 2017-06-02 NOTE — ED Provider Notes (Signed)
[facility]    CSN: [id number] Arrival date & time: 06/02/17  7829     History   Chief Complaint Chief Complaint  Patient presents with  . Headache  . Nausea  . Emesis    HPI Jerry Lester is a 37 y.o. male.   The history is provided by the patient.  URI  Presenting symptoms: fatigue, fever and rhinorrhea   Severity:  Moderate Onset quality:  Sudden Duration:  2 days Timing:  Constant Chronicity:  New Relieved by:  None tried Associated symptoms: headaches (has a h/o migraine and has had a migraine headache since yesterday not improved with usual migraine home medications) and myalgias (c/o bodyaches all over)   Associated symptoms: no wheezing   Risk factors: not elderly, no chronic cardiac disease, no chronic kidney disease, no chronic respiratory disease, no diabetes mellitus, no immunosuppression, no recent illness and no recent travel     Past Medical History:  Diagnosis Date  . Anxiety   . Chronic back pain   . Chronic neck pain     There are no active problems to display for this patient.   Past Surgical History:  Procedure Laterality Date  . APPENDECTOMY    . EYE SURGERY     Cataract Surgery  . HERNIA REPAIR         Home Medications    Prior to Admission medications   Medication Sig Start Date End Date Taking? Authorizing Provider  amitriptyline (ELAVIL) 50 MG tablet Take 50 mg by mouth at bedtime as needed for sleep.   Yes [provider]  cyclobenzaprine (FLEXERIL) 10 MG tablet Take 10 mg by mouth 3 (three) times daily as needed for muscle spasms.   Yes [provider]  escitalopram (LEXAPRO) 10 MG tablet Take 10 mg by mouth daily.   Yes [provider]  azithromycin (ZITHROMAX) 250 MG tablet Take 1 tablet (250 mg total) by mouth daily. Take first 2 tablets together, then 1 every day until finished. 05/10/16   Isa Rankin, MD  benzonatate (TESSALON) 200 MG capsule Take 1 capsule (200 mg total)  by mouth 3 (three) times daily as needed for cough. 05/10/16   Isa Rankin, MD  cetirizine-pseudoephedrine (ZYRTEC-D) 5-120 MG tablet Take 1 tablet by mouth 2 (two) times daily. 04/29/15   Lutricia Feil, PA-C  fluticasone (FLONASE) 50 MCG/ACT nasal spray Place 2 sprays into both nostrils daily. 04/29/15   Lutricia Feil, PA-C  HYDROcodone-acetaminophen (NORCO/VICODIN) 5-325 MG tablet 1-2 tabs po q 8 hours prn 06/02/17   Payton Mccallum, MD  ondansetron (ZOFRAN ODT) 8 MG disintegrating tablet Take 1 tablet (8 mg total) by mouth every 8 (eight) hours as needed. 06/02/17   Payton Mccallum, MD  oseltamivir (TAMIFLU) 75 MG capsule Take 1 capsule (75 mg total) by mouth 2 (two) times daily. 06/02/17   Payton Mccallum, MD  oxyCODONE-acetaminophen (PERCOCET/ROXICET) 5-325 MG per tablet Take by mouth every 6 (six) hours as needed for severe pain.    [provider]  VERAPAMIL HCL PO Take 240 mg by mouth at bedtime.     [provider]    Family History Family History  Problem Relation Age of Onset  . Migraines Mother   . Migraines Father     Social History Social History   Tobacco Use  . Smoking status: Former Games developer  . Smokeless tobacco: Never Used  Substance Use Topics  . Alcohol use: No  . Drug use: No  Allergies   Wellbutrin [bupropion]   Review of Systems Review of Systems  Constitutional: Positive for fatigue and fever.  HENT: Positive for rhinorrhea.   Respiratory: Negative for wheezing.   Musculoskeletal: Positive for myalgias (c/o bodyaches all over).  Neurological: Positive for headaches (has a h/o migraine and has had a migraine headache since yesterday not improved with usual migraine home medications).     Physical Exam Triage Vital Signs ED Triage Vitals  Enc Vitals Group     BP 06/02/17 0927 118/87     Pulse Rate 06/02/17 0927 (!) 131     Resp 06/02/17 0927 16     Temp 06/02/17 0927 (!) 101.6 F (38.7 C)     Temp Source 06/02/17  0927 Oral     SpO2 06/02/17 0927 96 %     Weight 06/02/17 0929 175 lb (79.4 kg)     Height 06/02/17 0929 5\' 10"  (1.778 m)     Head Circumference --      Peak Flow --      Pain Score 06/02/17 0930 10     Pain Loc --      Pain Edu? --      Excl. in GC? --    No data found.  Updated Vital Signs BP 118/87 (BP Location: Left Arm)   Pulse (!) 131   Temp (!) 101.2 F (38.4 C)   Resp 16   Ht 5\' 10"  (1.778 m)   Wt 175 lb (79.4 kg)   SpO2 96%   BMI 25.11 kg/m   Visual Acuity Right Eye Distance:   Left Eye Distance:   Bilateral Distance:    Right Eye Near:   Left Eye Near:    Bilateral Near:     Physical Exam  Constitutional: He is oriented to person, place, and time. He appears well-developed and well-nourished. No distress.  HENT:  Head: Normocephalic and atraumatic.  Right Ear: Tympanic membrane, external ear and ear canal normal.  Left Ear: Tympanic membrane, external ear and ear canal normal.  Nose: Nose normal.  Mouth/Throat: Uvula is midline, oropharynx is clear and moist and mucous membranes are normal. No oropharyngeal exudate or tonsillar abscesses.  Eyes: Conjunctivae and EOM are normal. Pupils are equal, round, and reactive to light. Right eye exhibits no discharge. Left eye exhibits no discharge. No scleral icterus.  Neck: Normal range of motion. Neck supple. No tracheal deviation present. No thyromegaly present.  Cardiovascular: Normal rate, regular rhythm and normal heart sounds.  Pulmonary/Chest: Effort normal and breath sounds normal. No stridor. No respiratory distress. He has no wheezes. He has no rales. He exhibits no tenderness.  Lymphadenopathy:    He has no cervical adenopathy.  Neurological: He is alert and oriented to person, place, and time. He displays normal reflexes. No cranial nerve deficit or sensory deficit. He exhibits normal muscle tone. Coordination normal.  Skin: Skin is warm and dry. No rash noted. He is not diaphoretic.  Nursing note and  vitals reviewed.    UC Treatments / Results  Labs (all labs ordered are listed, but only abnormal results are displayed) Labs Reviewed  BASIC METABOLIC PANEL - Abnormal; Notable for the following components:      Result Value   Sodium 134 (*)    Glucose, Bld 133 (*)    All other components within normal limits    EKG  EKG Interpretation None       Radiology No results found.  Procedures Procedures (including critical care time)  Medications Ordered in UC Medications  ondansetron (ZOFRAN-ODT) disintegrating tablet 8 mg (8 mg Oral Given 06/02/17 0941)  acetaminophen (TYLENOL) tablet 1,000 mg (1,000 mg Oral Given 06/02/17 0940)  ketorolac (TORADOL) injection 60 mg (60 mg Intramuscular Given 06/02/17 1010)     Initial Impression / Assessment and Plan / UC Course  I have reviewed the triage vital signs and the nursing notes.  Pertinent labs & imaging results that were available during my care of the patient were reviewed by me and considered in my medical decision making (see chart for details).       Final Clinical Impressions(s) / UC Diagnoses   Final diagnoses:  Migraine without aura and without status migrainosus, not intractable  Influenza-like illness    ED Discharge Orders        Ordered    oseltamivir (TAMIFLU) 75 MG capsule  2 times daily     06/02/17 1057    ondansetron (ZOFRAN ODT) 8 MG disintegrating tablet  Every 8 hours PRN     06/02/17 1057    HYDROcodone-acetaminophen (NORCO/VICODIN) 5-325 MG tablet     06/02/17 1057     1. Lab results and diagnosis reviewed with patient 2. Given Toradol 60mg  IM x 1 and zofran 8mg  odt with improvement of symptoms 3. rx as per orders above; reviewed possible side effects, interactions, risks and benefits  4. Follow-up prn if symptoms worsen or don't improve  Controlled Substance Prescriptions [place] Controlled Substance Registry consulted? Not Applicable   Payton Mccallum, MD 06/02/17 [phone]

## 2017-06-02 NOTE — ED Triage Notes (Signed)
Headache x3 days. Hx of migraines. This morning worse and also N/V, dizziness.

## 2017-06-05 ENCOUNTER — Telehealth: Payer: Self-pay

## 2017-06-05 NOTE — Telephone Encounter (Signed)
Called to follow up with patient since visit here at [facility]. Patient instructed to call back with any questions or concerns. MAH  

## 2017-06-22 DIAGNOSIS — M25562 Pain in left knee: Secondary | ICD-10-CM | POA: Diagnosis not present

## 2017-06-22 DIAGNOSIS — K591 Functional diarrhea: Secondary | ICD-10-CM | POA: Diagnosis not present

## 2017-06-22 DIAGNOSIS — L678 Other hair color and hair shaft abnormalities: Secondary | ICD-10-CM | POA: Diagnosis not present

## 2017-07-03 DIAGNOSIS — F633 Trichotillomania: Secondary | ICD-10-CM | POA: Diagnosis not present

## 2017-07-03 DIAGNOSIS — L739 Follicular disorder, unspecified: Secondary | ICD-10-CM | POA: Diagnosis not present

## 2017-07-03 DIAGNOSIS — L700 Acne vulgaris: Secondary | ICD-10-CM | POA: Diagnosis not present

## 2017-07-03 DIAGNOSIS — L219 Seborrheic dermatitis, unspecified: Secondary | ICD-10-CM | POA: Diagnosis not present

## 2017-07-03 DIAGNOSIS — L659 Nonscarring hair loss, unspecified: Secondary | ICD-10-CM | POA: Diagnosis not present

## 2017-07-03 DIAGNOSIS — L639 Alopecia areata, unspecified: Secondary | ICD-10-CM | POA: Diagnosis not present

## 2017-07-17 ENCOUNTER — Ambulatory Visit
Admission: EM | Admit: 2017-07-17 | Discharge: 2017-07-17 | Disposition: A | Discharge status: Home / Self Care | Payer: Medicare Other | Attending: Family Medicine | Admitting: Family Medicine

## 2017-07-17 ENCOUNTER — Other Ambulatory Visit: Payer: Self-pay

## 2017-07-17 ENCOUNTER — Encounter: Payer: Self-pay | Admitting: Emergency Medicine

## 2017-07-17 DIAGNOSIS — R51 Headache: Secondary | ICD-10-CM

## 2017-07-17 DIAGNOSIS — J029 Acute pharyngitis, unspecified: Secondary | ICD-10-CM

## 2017-07-17 DIAGNOSIS — J111 Influenza due to unidentified influenza virus with other respiratory manifestations: Secondary | ICD-10-CM

## 2017-07-17 DIAGNOSIS — M791 Myalgia, unspecified site: Secondary | ICD-10-CM | POA: Diagnosis not present

## 2017-07-17 DIAGNOSIS — R05 Cough: Secondary | ICD-10-CM | POA: Diagnosis not present

## 2017-07-17 DIAGNOSIS — R69 Illness, unspecified: Secondary | ICD-10-CM | POA: Diagnosis not present

## 2017-07-17 DIAGNOSIS — R0981 Nasal congestion: Secondary | ICD-10-CM | POA: Diagnosis not present

## 2017-07-17 LAB — RAPID STREP SCREEN (MED CTR MEBANE ONLY): Streptococcus, Group A Screen (Direct): NEGATIVE

## 2017-07-17 MED ORDER — HYDROCOD POLST-CPM POLST ER 10-8 MG/5ML PO SUER: 5.0000 mL | Freq: Every evening | ORAL | 0 refills | Status: AC | PRN

## 2017-07-17 MED ORDER — OSELTAMIVIR PHOSPHATE 75 MG PO CAPS: 75.0000 mg | ORAL_CAPSULE | Freq: Two times a day (BID) | ORAL | 0 refills | Status: AC

## 2017-07-17 MED ORDER — BENZONATATE 100 MG PO CAPS: 100.0000 mg | ORAL_CAPSULE | Freq: Three times a day (TID) | ORAL | 0 refills | Status: AC | PRN

## 2017-07-17 NOTE — Discharge Instructions (Signed)
Take medication as prescribed. Rest. Drink plenty of fluids.  ° °Follow up with your primary care physician this week as needed. Return to Urgent care for new or worsening concerns.  ° °

## 2017-07-17 NOTE — ED Triage Notes (Signed)
Patient in today c/o cough, sore throat, headache x 3 days. Patient has had chills, but hasn't taken temperature. Patient has tried OTC Zicam, Robitussin DM and severe Flu.

## 2017-07-17 NOTE — ED Provider Notes (Signed)
[facility] ____________________________________________  Time seen: Approximately 1220 PM  I have reviewed the triage vital signs and the nursing notes.   HISTORY  Chief Complaint Cough   HPI Verlin Grillslexander Salehi is a 37 y.o. male presenting for evaluation of almost 3 days of runny nose, nasal congestion, sore throat, headache, chills, body aches and sensation of fever.  Has not measured temperature at home.  Reports his niece recently sick with similar just prior to his sickness onset, and reports that she works at a daycare.  States he has tried multiple over-the-counter medications without much improvement.  No antipyretic taken just prior to arrival.  States feels tired.  Has continue to drink fluids well, decreased appetite.  States generalized body soreness, denies other pain. Denies chest pain, shortness of breath, abdominal pain, or rash. Denies recent sickness. Denies recent antibiotic use.    Past Medical History:  Diagnosis Date  . Anxiety   . Chronic back pain   . Chronic neck pain     There are no active problems to display for this patient.   Past Surgical History:  Procedure Laterality Date  . APPENDECTOMY    . EYE SURGERY     Cataract Surgery  . HERNIA REPAIR       No current facility-administered medications for this encounter.   Current Outpatient Medications:  .  amitriptyline (ELAVIL) 50 MG tablet, Take 50 mg by mouth at bedtime as needed for sleep., Disp: , Rfl:  .  cyclobenzaprine (FLEXERIL) 10 MG tablet, Take 10 mg by mouth 3 (three) times daily as needed for muscle spasms., Disp: , Rfl:  .  escitalopram (LEXAPRO) 10 MG tablet, Take 10 mg by mouth daily., Disp: , Rfl:  .  ondansetron (ZOFRAN ODT) 8 MG disintegrating tablet, Take 1 tablet (8 mg total) by mouth every 8 (eight) hours as needed., Disp: 6 tablet, Rfl: 0 .  benzonatate (TESSALON PERLES) 100 MG capsule, Take 1 capsule (100 mg total) by mouth 3 (three) times daily as needed for  cough., Disp: 15 capsule, Rfl: 0 .  chlorpheniramine-HYDROcodone (TUSSIONEX PENNKINETIC ER) 10-8 MG/5ML SUER, Take 5 mLs by mouth at bedtime as needed for cough. do not drive or operate machinery while taking as can cause drowsiness., Disp: 50 mL, Rfl: 0 .  oseltamivir (TAMIFLU) 75 MG capsule, Take 1 capsule (75 mg total) by mouth every 12 (twelve) hours., Disp: 10 capsule, Rfl: 0 .  VERAPAMIL HCL PO, Take 240 mg by mouth at bedtime. , Disp: , Rfl:   Allergies Wellbutrin [bupropion]  Family History  Problem Relation Age of Onset  . Migraines Mother   . Migraines Father     Social History Social History   Tobacco Use  . Smoking status: Former Smoker    Last attempt to quit: 07/18/2010    Years since quitting: 7.0  . Smokeless tobacco: Never Used  Substance Use Topics  . Alcohol use: No  . Drug use: No    Review of Systems Constitutional: As above.  ENT: as above.  Cardiovascular: Denies chest pain. Respiratory: Denies shortness of breath. Gastrointestinal: No abdominal pain.   Musculoskeletal: Negative for back pain. Skin: Negative for rash.  ____________________________________________   PHYSICAL EXAM:  VITAL SIGNS: ED Triage Vitals  Enc Vitals Group     BP 07/17/17 1145 (!) 143/94     Pulse Rate 07/17/17 1145 (!) 111     Resp 07/17/17 1145 20     Temp 07/17/17 1145 98.2 F (36.8 C)  Temp Source 07/17/17 1145 Oral     SpO2 07/17/17 1145 98 %     Weight 07/17/17 1146 175 lb (79.4 kg)     Height 07/17/17 1146 5\' 10"  (1.778 m)     Head Circumference --      Peak Flow --      Pain Score 07/17/17 1146 7     Pain Loc --      Pain Edu? --      Excl. in GC? --     Constitutional: Alert and oriented. Well appearing and in no acute distress. Eyes: Conjunctivae are normal.  Head: Atraumatic. No sinus tenderness to palpation. No swelling. No erythema.  Ears: no erythema, normal TMs bilaterally.   Nose:Nasal congestion with clear rhinorrhea  Mouth/Throat: Mucous  membranes are moist. No pharyngeal erythema. No tonsillar swelling or exudate.  Neck: No stridor.  No cervical spine tenderness to palpation. Hematological/Lymphatic/Immunilogical: No cervical lymphadenopathy. Cardiovascular: Normal rate, regular rhythm. Grossly normal heart sounds.  Good peripheral circulation. Respiratory: Normal respiratory effort.  No retractions. No wheezes, rales or rhonchi. Good air movement.  Musculoskeletal: Ambulatory with steady gait. No cervical, thoracic or lumbar tenderness to palpation. Neurologic:  Normal speech and language. No gait instability. Skin:  Skin appears warm, dry and intact. No rash noted. Psychiatric: Mood and affect are normal. Speech and behavior are normal.  ___________________________________________   LABS (all labs ordered are listed, but only abnormal results are displayed)  Labs Reviewed  RAPID STREP SCREEN (NOT AT [facility])  CULTURE, GROUP A STREP [facility])     PROCEDURES Procedures   INITIAL IMPRESSION / ASSESSMENT AND PLAN / ED COURSE  Pertinent labs & imaging results that were available during my care of the patient were reviewed by me and considered in my medical decision making (see chart for details).  Well-appearing patient.  No acute distress.  Suspect influenza-like illness.  Discussed use of Tamiflu as well as timeframe medication, patient requests Rx.  Also PRN Tessalon Perles and as needed Tussionex.  Encourage rest, fluids, supportive care.  Patient requested strep swab which was negative.Discussed indication, risks and benefits of medications with patient.  Discussed follow up with Primary care physician this week. Discussed follow up and return parameters including no resolution or any worsening concerns. Patient verbalized understanding and agreed to plan.   ____________________________________________   FINAL CLINICAL IMPRESSION(S) / ED DIAGNOSES  Final diagnoses:  Influenza-like illness     ED Discharge  Orders        Ordered    chlorpheniramine-HYDROcodone (TUSSIONEX PENNKINETIC ER) 10-8 MG/5ML SUER  At bedtime PRN     07/17/17 1230    benzonatate (TESSALON PERLES) 100 MG capsule  3 times daily PRN     07/17/17 1230    oseltamivir (TAMIFLU) 75 MG capsule  Every 12 hours     07/17/17 1230       Note: This dictation was prepared with Dragon dictation along with smaller phrase technology. Any transcriptional errors that result from this process are unintentional.         Renford Dills, NP 07/17/17 1352

## 2017-07-20 LAB — CULTURE, GROUP A STREP (THRC)

## 2017-07-23 DIAGNOSIS — R259 Unspecified abnormal involuntary movements: Secondary | ICD-10-CM | POA: Diagnosis not present

## 2017-07-23 DIAGNOSIS — R1310 Dysphagia, unspecified: Secondary | ICD-10-CM | POA: Diagnosis not present

## 2017-07-23 DIAGNOSIS — E213 Hyperparathyroidism, unspecified: Secondary | ICD-10-CM | POA: Diagnosis not present

## 2017-07-23 DIAGNOSIS — H9012 Conductive hearing loss, unilateral, left ear, with unrestricted hearing on the contralateral side: Secondary | ICD-10-CM | POA: Diagnosis not present

## 2017-07-23 DIAGNOSIS — H5789 Other specified disorders of eye and adnexa: Secondary | ICD-10-CM | POA: Diagnosis not present

## 2017-07-23 DIAGNOSIS — R471 Dysarthria and anarthria: Secondary | ICD-10-CM | POA: Diagnosis not present

## 2017-07-23 DIAGNOSIS — M3500 Sicca syndrome, unspecified: Secondary | ICD-10-CM | POA: Diagnosis not present

## 2017-07-23 DIAGNOSIS — Z23 Encounter for immunization: Secondary | ICD-10-CM | POA: Diagnosis not present

## 2017-07-23 DIAGNOSIS — Z79899 Other long term (current) drug therapy: Secondary | ICD-10-CM | POA: Diagnosis not present

## 2017-07-23 DIAGNOSIS — F909 Attention-deficit hyperactivity disorder, unspecified type: Secondary | ICD-10-CM | POA: Diagnosis not present

## 2017-07-23 DIAGNOSIS — R258 Other abnormal involuntary movements: Secondary | ICD-10-CM | POA: Diagnosis not present

## 2017-07-23 DIAGNOSIS — Z8619 Personal history of other infectious and parasitic diseases: Secondary | ICD-10-CM | POA: Diagnosis not present

## 2017-07-23 DIAGNOSIS — G43709 Chronic migraine without aura, not intractable, without status migrainosus: Secondary | ICD-10-CM | POA: Diagnosis not present

## 2017-07-23 DIAGNOSIS — Z87891 Personal history of nicotine dependence: Secondary | ICD-10-CM | POA: Diagnosis not present

## 2017-07-25 DIAGNOSIS — H40003 Preglaucoma, unspecified, bilateral: Secondary | ICD-10-CM | POA: Diagnosis not present

## 2017-07-25 DIAGNOSIS — Z961 Presence of intraocular lens: Secondary | ICD-10-CM | POA: Diagnosis not present

## 2017-07-25 DIAGNOSIS — H26493 Other secondary cataract, bilateral: Secondary | ICD-10-CM | POA: Diagnosis not present

## 2017-08-02 DIAGNOSIS — R198 Other specified symptoms and signs involving the digestive system and abdomen: Secondary | ICD-10-CM | POA: Diagnosis not present

## 2017-08-23 DIAGNOSIS — H40013 Open angle with borderline findings, low risk, bilateral: Secondary | ICD-10-CM | POA: Diagnosis not present

## 2017-08-30 DIAGNOSIS — L659 Nonscarring hair loss, unspecified: Secondary | ICD-10-CM | POA: Diagnosis not present

## 2017-08-30 DIAGNOSIS — F633 Trichotillomania: Secondary | ICD-10-CM | POA: Diagnosis not present

## 2017-08-30 DIAGNOSIS — L700 Acne vulgaris: Secondary | ICD-10-CM | POA: Diagnosis not present

## 2017-08-30 DIAGNOSIS — L219 Seborrheic dermatitis, unspecified: Secondary | ICD-10-CM | POA: Diagnosis not present

## 2017-08-30 DIAGNOSIS — L739 Follicular disorder, unspecified: Secondary | ICD-10-CM | POA: Diagnosis not present

## 2017-08-30 DIAGNOSIS — L639 Alopecia areata, unspecified: Secondary | ICD-10-CM | POA: Diagnosis not present

## 2017-09-10 IMAGING — CR DG RIBS W/ CHEST 3+V*R*
5 series · 5 of 5 positions shown · non-contrast
Comparison: None.

CLINICAL DATA: Right rib pain, fall 2 days ago.

EXAM:
RIGHT RIBS AND CHEST - 3+ VIEW

[chest pa]
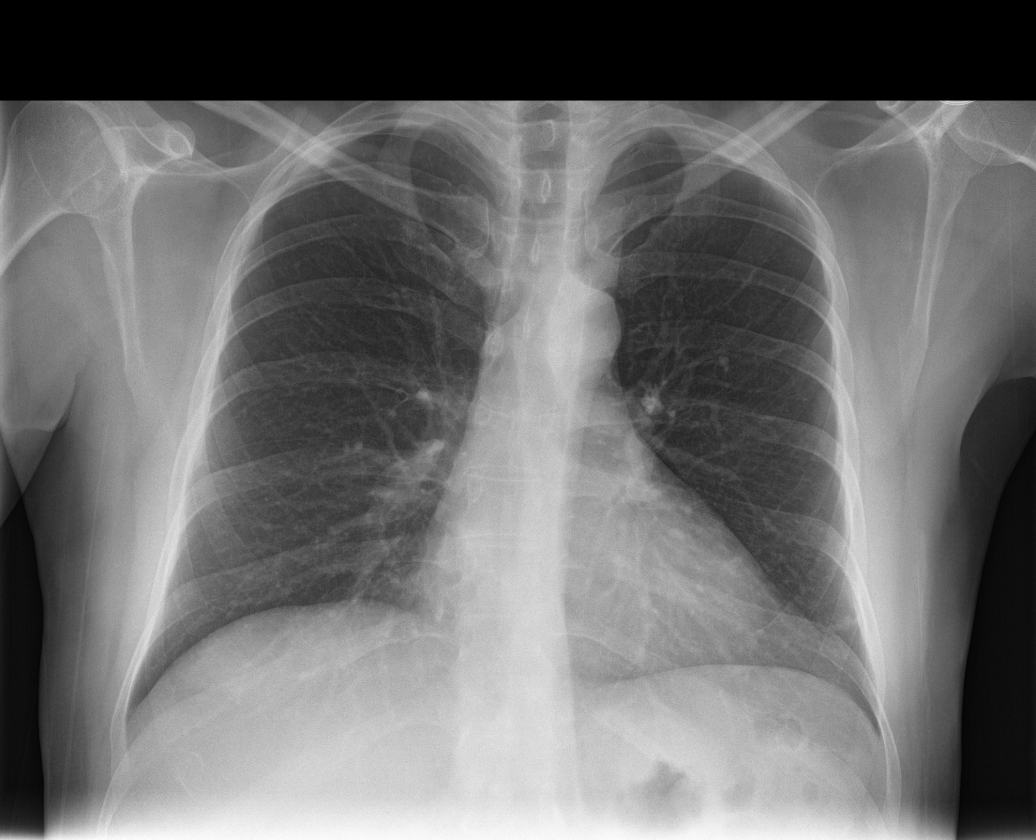

[rib pa (1 of 2)]
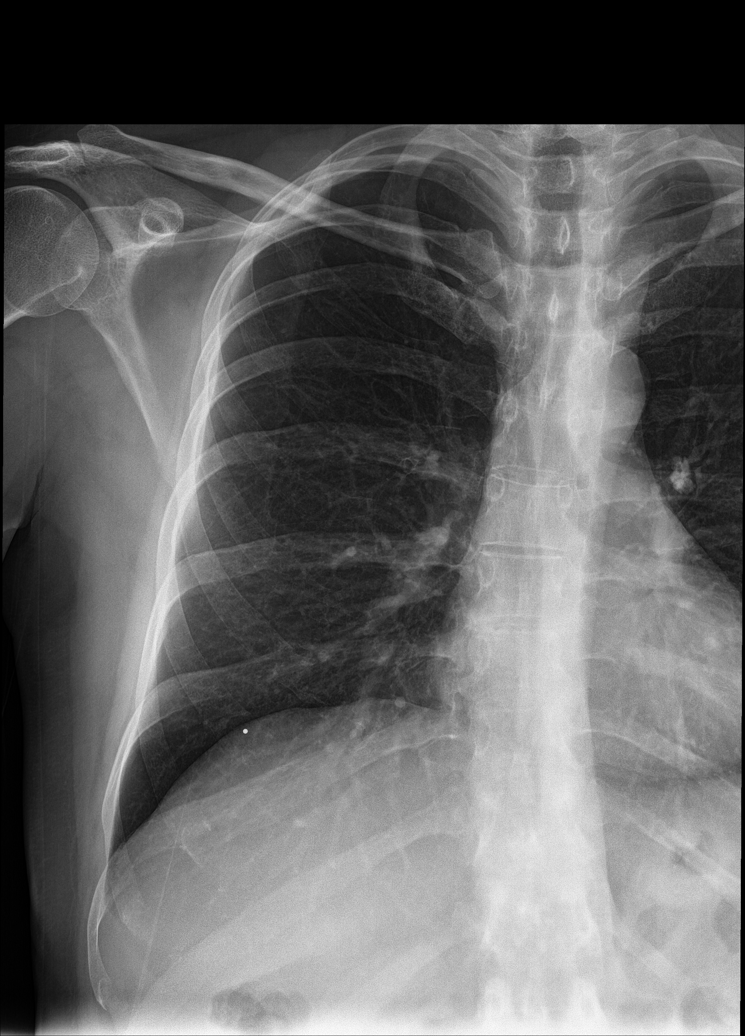

[rib pa (2 of 2)]
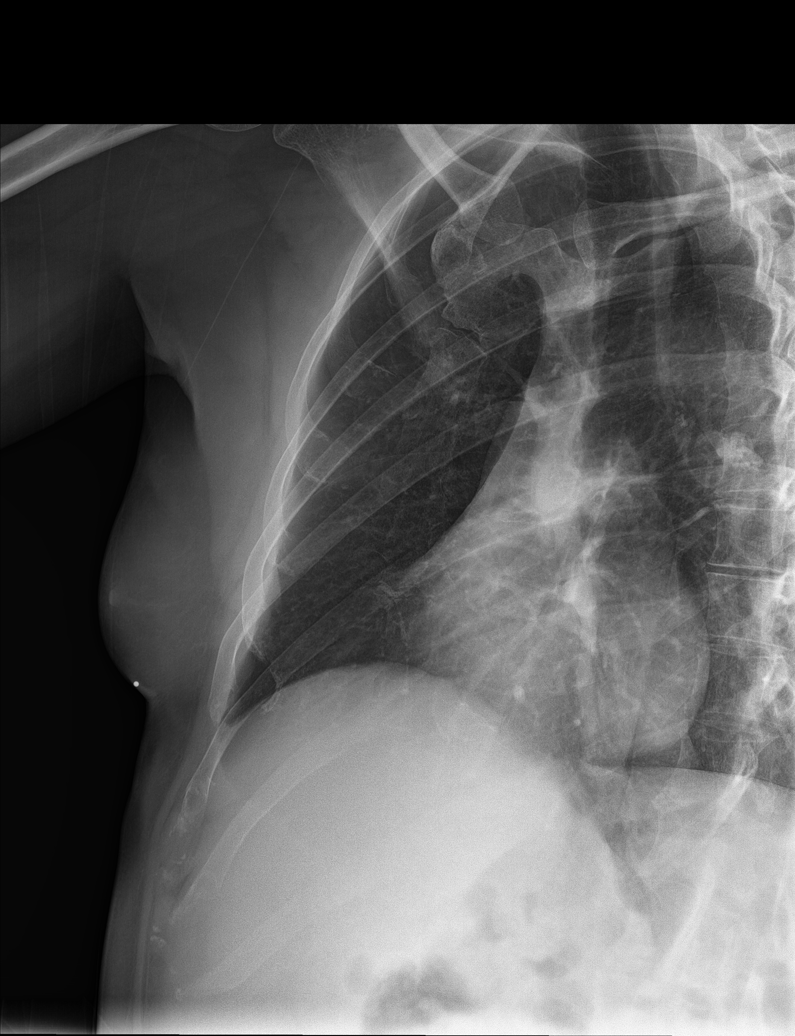

[rib obl (1 of 2)]
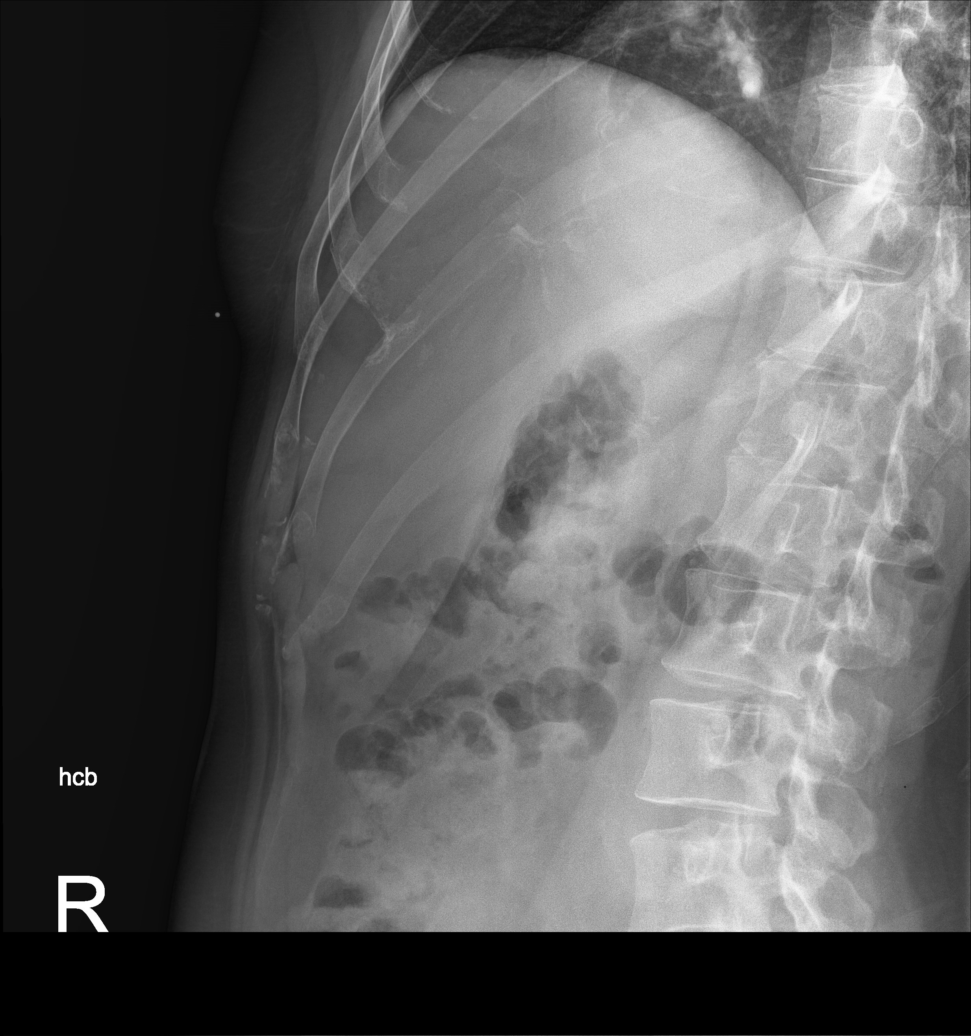

[rib obl (2 of 2)]
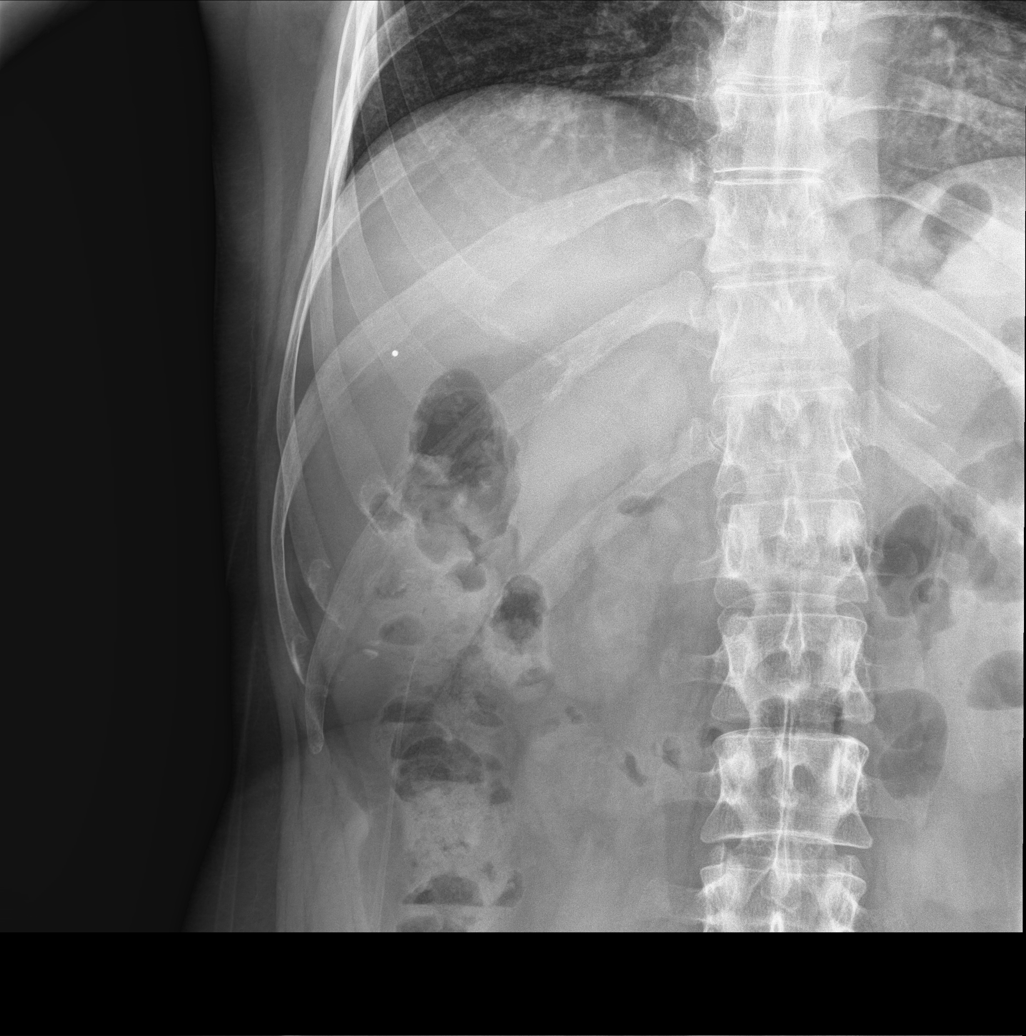

[5 of 5 positions shown; findings below may reference images not displayed]

FINDINGS: No pneumothorax or pleural effusion. Linear subsegmental atelectasis
or scarring along the left lung base laterally.

Cardiac and mediastinal margins appear normal.

On one of the views, there is cortical irregularity of the anterior
most portion of the right fifth and sixth rib suspicious for
nondisplaced fractures.
IMPRESSION: 1. Nondisplaced right fifth and sixth rib fractures anteriorly. No
pleural effusion or pneumothorax.
